# Patient Record
Sex: Male | Born: 1995 | Race: Black or African American | Hispanic: No | Marital: Single | Smoking: Never smoker
Health system: Southern US, Community
[De-identification: ages and names within clinical notes are randomized; demographics above are authoritative.]

---
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
## 2015-09-02 ENCOUNTER — Encounter (HOSPITAL_COMMUNITY): Payer: Self-pay | Admitting: Emergency Medicine

## 2015-09-02 ENCOUNTER — Emergency Department (HOSPITAL_COMMUNITY)
Admission: EM | Admit: 2015-09-02 | Discharge: 2015-09-02 | Disposition: A | Discharge status: Home / Self Care | Payer: BLUE CROSS/BLUE SHIELD | Attending: Emergency Medicine | Admitting: Emergency Medicine

## 2015-09-02 DIAGNOSIS — N342 Other urethritis: Secondary | ICD-10-CM

## 2015-09-02 DIAGNOSIS — R350 Frequency of micturition: Secondary | ICD-10-CM | POA: Diagnosis present

## 2015-09-02 LAB — URINALYSIS, ROUTINE W REFLEX MICROSCOPIC
BILIRUBIN URINE: NEGATIVE
GLUCOSE, UA: NEGATIVE mg/dL
KETONES UR: NEGATIVE mg/dL
Nitrite: NEGATIVE
PH: 7 (ref 5.0–8.0)
Protein, ur: 30 mg/dL — AB
SPECIFIC GRAVITY, URINE: 1.025 (ref 1.005–1.030)

## 2015-09-02 LAB — CBG MONITORING, ED: Glucose-Capillary: 115 mg/dL — ABNORMAL HIGH (ref 65–99)

## 2015-09-02 LAB — URINE MICROSCOPIC-ADD ON: BACTERIA UA: NONE SEEN

## 2015-09-02 MED ORDER — CEFTRIAXONE SODIUM 250 MG IJ SOLR
250.0000 mg | Freq: Once | INTRAMUSCULAR | Status: AC
  Administered 2015-09-02: 250 mg via INTRAMUSCULAR
  Filled 2015-09-02: qty 250

## 2015-09-02 MED ORDER — AZITHROMYCIN 250 MG PO TABS
1000.0000 mg | ORAL_TABLET | Freq: Once | ORAL | Status: AC
  Administered 2015-09-02: 1000 mg via ORAL
  Filled 2015-09-02: qty 4

## 2015-09-02 NOTE — ED Notes (Signed)
Pt in from home with c/o frequent urination since Friday. Reporting some discomfort and hematuria while urinating.

## 2015-09-02 NOTE — Discharge Instructions (Signed)
It was our pleasure to provide your ER care today - we hope that you feel better.  You were treated in the ED today with 2 antibiotics, which should treat your symptoms.  Swabs were sent to the lab, the results of which will be back in 1-2 days.  No sex until after symptoms have completely resolved.   Have any sexual partner(s) checked by their doctor.   Follow up with primary care doctor in 1 week if symptoms fail to improve/resolve.  Return to ER if worse, new symptoms, fevers, abdominal pain, other concern.      Urethritis, Adult Urethritis is an inflammation of the tube through which urine exits your bladder (urethra).  CAUSES Urethritis is often caused by an infection in your urethra. The infection can be viral, like herpes. The infection can also be bacterial, like gonorrhea. RISK FACTORS Risk factors of urethritis include:  Having sex without using a condom.  Having multiple sexual partners.  Having poor hygiene. SIGNS AND SYMPTOMS Symptoms of urethritis are less noticeable in women than in men. These symptoms include:  Burning feeling when you urinate (dysuria).  Discharge from your urethra.  Blood in your urine (hematuria).  Urinating more than usual. DIAGNOSIS  To confirm a diagnosis of urethritis, your health care provider will do the following:  Ask about your sexual history.  Perform a physical exam.  Have you provide a sample of your urine for lab testing.  Use a cotton swab to gently collect a sample from your urethra for lab testing. TREATMENT  It is important to treat urethritis. Depending on the cause, untreated urethritis may lead to serious genital infections and possibly infertility. Urethritis caused by a bacterial infection is treated with antibiotic medicine. All sexual partners must be treated.  HOME CARE INSTRUCTIONS  Do not have sex until the test results are known and treatment is completed, even if your symptoms go away before you  finish treatment.  If you were prescribed an antibiotic, finish it all even if you start to feel better. SEEK MEDICAL CARE IF:   Your symptoms are not improved in 3 days.  Your symptoms are getting worse.  You develop abdominal pain or pelvic pain (in women).  You develop joint pain.  You have a fever. SEEK IMMEDIATE MEDICAL CARE IF:   You have severe pain in the belly, back, or side.  You have repeated vomiting. MAKE SURE YOU:  Understand these instructions.  Will watch your condition.  Will get help right away if you are not doing well or get worse.   This information is not intended to replace advice given to you by your health care provider. Make sure you discuss any questions you have with your health care provider.   Document Released: 09/09/2000 Document Revised: 07/31/2014 Document Reviewed: 11/14/2012 Elsevier Interactive Patient Education 2016 [employer]   Safe Sex Safe sex is about reducing the risk of giving or getting a sexually transmitted disease (STD). STDs are spread through sexual contact involving the genitals, mouth, or rectum. Some STDs can be cured and others cannot. Safe sex can also prevent unintended pregnancies.  WHAT ARE SOME SAFE SEX PRACTICES?  Limit your sexual activity to only one partner who is having sex with only you.  Talk to your partner about his or her past partners, past STDs, and drug use.  Use a condom every time you have sexual intercourse. This includes vaginal, oral, and anal sexual activity. Both females and males should wear condoms  during oral sex. Only use latex or polyurethane condoms and water-based lubricants. Using petroleum-based lubricants or oils to lubricate a condom will weaken the condom and increase the chance that it will break. The condom should be in place from the beginning to the end of sexual activity. Wearing a condom reduces, but does not completely eliminate, your risk of getting or giving an STD. STDs  can be spread by contact with infected body fluids and skin.  Get vaccinated for hepatitis B and HPV.  Avoid alcohol and recreational drugs, which can affect your judgment. You may forget to use a condom or participate in high-risk sex.  For females, avoid douching after sexual intercourse. Douching can spread an infection farther into the reproductive tract.  Check your body for signs of sores, blisters, rashes, or unusual discharge. See your health care provider if you notice any of these signs.  Avoid sexual contact if you have symptoms of an infection or are being treated for an STD. If you or your partner has herpes, avoid sexual contact when blisters are present. Use condoms at all other times.  If you are at risk of being infected with HIV, it is recommended that you take a prescription medicine daily to prevent HIV infection. This is called pre-exposure prophylaxis (PrEP). You are considered at risk if:  You are a man who has sex with other men (MSM).  You are a heterosexual man or woman who is sexually active with more than one partner.  You take drugs by injection.  You are sexually active with a partner who has HIV.  Talk with your health care provider about whether you are at high risk of being infected with HIV. If you choose to begin PrEP, you should first be tested for HIV. You should then be tested every 3 months for as long as you are taking PrEP.  See your health care provider for regular screenings, exams, and tests for other STDs. Before having sex with a new partner, each of you should be screened for STDs and should talk about the results with each other. WHAT ARE THE BENEFITS OF SAFE SEX?   There is less chance of getting or giving an STD.  You can prevent unwanted or unintended pregnancies.  By discussing safe sex concerns with your partner, you may increase feelings of intimacy, comfort, trust, and honesty between the two of you.   This information is not  intended to replace advice given to you by your health care provider. Make sure you discuss any questions you have with your health care provider.   Document Released: 04/23/2004 Document Revised: 04/06/2014 Document Reviewed: 09/07/2011 Elsevier Interactive Patient Education [employer]

## 2015-09-02 NOTE — ED Provider Notes (Signed)
CSN: [id number]     Arrival date & time 09/02/15  0815 History   First MD Initiated Contact with Patient 09/02/15 0825     Chief Complaint  Patient presents with  . Urinary Frequency     (Consider location/radiation/quality/duration/timing/severity/associated sxs/prior Treatment) Patient is a 20 y.o. male presenting with frequency. The history is provided by the patient.  Urinary Frequency Pertinent negatives include no abdominal pain.  Patient c/o urinary frequency, mild dysuria, for the past few days. ?scant penile discharge. Symptoms episodic, persistent. Is sexually active w single partner, no known std exposure.  No hx same symptoms previously. No polydipsia, no hx dm. No fever or chills. Denies back or flank pain.      History reviewed. No pertinent past medical history. History reviewed. No pertinent past surgical history. No family history on file. Social History  Substance Use Topics  . Smoking status: Never Smoker   . Smokeless tobacco: None  . Alcohol Use: No    Review of Systems  Constitutional: Negative for fever and chills.  HENT: Negative for sore throat.   Gastrointestinal: Negative for nausea, vomiting and abdominal pain.  Genitourinary: Positive for dysuria and frequency. Negative for flank pain.  Skin: Negative for rash.      Allergies  Review of patient's allergies indicates no known allergies.  Home Medications   Prior to Admission medications   Medication Sig Start Date End Date Taking? Authorizing Provider  Chlorpheniramine Maleate (ALLERGY RELIEF PO) Take 1 tablet by mouth daily.    Historical Provider, MD   BP 152/101 mmHg  Pulse 97  Temp(Src) 97.8 F (36.6 C) (Oral)  Resp 18  Ht  (1.727 m)  Wt 79.379 kg  BMI 26.61 kg/m2  SpO2 99% Physical Exam  Constitutional: He is oriented to person, place, and time. He appears well-developed and well-nourished. No distress.  Eyes: Conjunctivae are normal. No scleral icterus.  Neck: Neck  supple. No tracheal deviation present.  Cardiovascular: Normal rate.   Pulmonary/Chest: Effort normal. No accessory muscle usage. No respiratory distress.  Abdominal: Soft. He exhibits no distension. There is no tenderness.  Genitourinary:  Scant penile discharge. No abd or cva/flank tenderness.   Musculoskeletal: Normal range of motion.  Neurological: He is alert and oriented to person, place, and time.  Skin: Skin is warm and dry. No rash noted. He is not diaphoretic.  Psychiatric: He has a normal mood and affect.  Nursing note and vitals reviewed.   ED Course  Procedures (including critical care time) Labs Review  Results for orders placed or performed during the hospital encounter of 09/02/15  Urinalysis, Routine w reflex microscopic (not at [facility])  Result Value Ref Range   Color, Urine YELLOW YELLOW   APPearance HAZY (A) CLEAR   Specific Gravity, Urine 1.025 1.005 - 1.030   pH 7.0 5.0 - 8.0   Glucose, UA NEGATIVE NEGATIVE mg/dL   Hgb urine dipstick MODERATE (A) NEGATIVE   Bilirubin Urine NEGATIVE NEGATIVE   Ketones, ur NEGATIVE NEGATIVE mg/dL   Protein, ur 30 (A) NEGATIVE mg/dL   Nitrite NEGATIVE NEGATIVE   Leukocytes, UA MODERATE (A) NEGATIVE  Urine microscopic-add on  Result Value Ref Range   Squamous Epithelial / LPF 0-5 (A) NONE SEEN   WBC, UA TOO NUMEROUS TO COUNT 0 - 5 WBC/hpf   RBC / HPF TOO NUMEROUS TO COUNT 0 - 5 RBC/hpf   Bacteria, UA NONE SEEN NONE SEEN  I have personally reviewed and evaluated these lab results as part of my medical decision-making.    MDM   Labs.  Reviewed nursing notes and prior charts for additional history.   Discussed tx options w pt, pt consents to tx.  Rocephin and zithromax given.   Discussed follow up and return precautions w pt.      Cathren LaineKevin Mylissa Lambe, MD 09/11/15 [phone]

## 2015-09-03 LAB — GC/CHLAMYDIA PROBE AMP (~~LOC~~) NOT AT ARMC
CHLAMYDIA, DNA PROBE: POSITIVE — AB
Neisseria Gonorrhea: NEGATIVE

## 2015-09-04 ENCOUNTER — Telehealth (HOSPITAL_BASED_OUTPATIENT_CLINIC_OR_DEPARTMENT_OTHER): Payer: Self-pay | Admitting: Emergency Medicine

## 2016-07-06 ENCOUNTER — Other Ambulatory Visit: Payer: Self-pay | Admitting: Occupational Medicine

## 2016-07-06 ENCOUNTER — Ambulatory Visit
Admission: RE | Admit: 2016-07-06 | Discharge: 2016-07-06 | Disposition: A | Discharge status: Home / Self Care | Payer: No Typology Code available for payment source | Source: Ambulatory Visit | Attending: Occupational Medicine | Admitting: Occupational Medicine

## 2016-07-06 DIAGNOSIS — Z021 Encounter for pre-employment examination: Secondary | ICD-10-CM

## 2016-07-21 ENCOUNTER — Emergency Department (HOSPITAL_COMMUNITY)
Admission: EM | Admit: 2016-07-21 | Discharge: 2016-07-22 | Disposition: A | Discharge status: Home / Self Care | Payer: Self-pay | Attending: Emergency Medicine | Admitting: Emergency Medicine

## 2016-07-21 ENCOUNTER — Encounter (HOSPITAL_COMMUNITY): Payer: Self-pay | Admitting: Emergency Medicine

## 2016-07-21 DIAGNOSIS — R350 Frequency of micturition: Secondary | ICD-10-CM | POA: Insufficient documentation

## 2016-07-21 DIAGNOSIS — R3129 Other microscopic hematuria: Secondary | ICD-10-CM | POA: Insufficient documentation

## 2016-07-21 LAB — CBC WITH DIFFERENTIAL/PLATELET
Basophils Absolute: 0 10*3/uL (ref 0.0–0.1)
Basophils Relative: 0 %
Eosinophils Absolute: 0.1 10*3/uL (ref 0.0–0.7)
Eosinophils Relative: 1 %
HEMATOCRIT: 40.4 % (ref 39.0–52.0)
Hemoglobin: 13.9 g/dL (ref 13.0–17.0)
LYMPHS ABS: 3.5 10*3/uL (ref 0.7–4.0)
Lymphocytes Relative: 41 %
MCH: 29.3 pg (ref 26.0–34.0)
MCHC: 34.4 g/dL (ref 30.0–36.0)
MCV: 85.2 fL (ref 78.0–100.0)
MONO ABS: 0.8 10*3/uL (ref 0.1–1.0)
Monocytes Relative: 9 %
NEUTROS ABS: 4.1 10*3/uL (ref 1.7–7.7)
Neutrophils Relative %: 49 %
Platelets: 321 10*3/uL (ref 150–400)
RBC: 4.74 MIL/uL (ref 4.22–5.81)
RDW: 12.7 % (ref 11.5–15.5)
WBC: 8.5 10*3/uL (ref 4.0–10.5)

## 2016-07-21 LAB — URINALYSIS, ROUTINE W REFLEX MICROSCOPIC
BILIRUBIN URINE: NEGATIVE
Bacteria, UA: NONE SEEN
GLUCOSE, UA: NEGATIVE mg/dL
Ketones, ur: NEGATIVE mg/dL
NITRITE: NEGATIVE
PH: 6 (ref 5.0–8.0)
Protein, ur: NEGATIVE mg/dL
Specific Gravity, Urine: 1.026 (ref 1.005–1.030)

## 2016-07-21 LAB — BASIC METABOLIC PANEL
ANION GAP: 8 (ref 5–15)
BUN: 14 mg/dL (ref 6–20)
CALCIUM: 9.8 mg/dL (ref 8.9–10.3)
CO2: 26 mmol/L (ref 22–32)
Chloride: 104 mmol/L (ref 101–111)
Creatinine, Ser: 0.82 mg/dL (ref 0.61–1.24)
GFR calc Af Amer: >60 mL/min (ref >60)
GFR calc non Af Amer: >60 mL/min (ref >60)
GLUCOSE: 97 mg/dL (ref 65–99)
Potassium: 4 mmol/L (ref 3.5–5.1)
Sodium: 138 mmol/L (ref 135–145)

## 2016-07-21 MED ORDER — AZITHROMYCIN 250 MG PO TABS
1000.0000 mg | ORAL_TABLET | Freq: Once | ORAL | Status: AC
  Administered 2016-07-22: 1000 mg via ORAL
  Filled 2016-07-21: qty 4

## 2016-07-21 MED ORDER — CEFTRIAXONE SODIUM 250 MG IJ SOLR
250.0000 mg | Freq: Once | INTRAMUSCULAR | Status: AC
  Administered 2016-07-22: 250 mg via INTRAMUSCULAR
  Filled 2016-07-21: qty 250

## 2016-07-21 NOTE — ED Triage Notes (Signed)
Pt. suspects UTI reports urinary frequency , oliguria and bladder pressure onset last week , denies fever or chills .

## 2016-07-22 LAB — GC/CHLAMYDIA PROBE AMP (~~LOC~~) NOT AT ARMC
CHLAMYDIA, DNA PROBE: POSITIVE — AB
Neisseria Gonorrhea: NEGATIVE

## 2016-07-22 NOTE — Medical Student Note (Signed)
[facility]-EMERGENCY DEPT Provider Student Note For educational purposes for Medical, PA and NP students only and not part of the legal medical record.   CSN: [id number] Arrival date & time: 07/21/16  2055     History   Chief Complaint Chief Complaint  Patient presents with  . Urinary Tract Infection    HPI Joshua Horne is a 21 y.o. male. Patient with history of chlamydia reports he was seen at the health department a few days ago for a drug screening and was informed that he had an infection in his urine. He states that since then he has noticed an increased frequency in urination and feels that he isn't emptying his bladder completely due to the small amount of urine voided and feeling of bladder fullness. He denies dysuria, penile discharge, lesions, sores or pain elsewhere. Patient also denies fever, chills, nausea, vomiting, back, flank, and abdominal pain. Patient is sexually active, reporting he has one sexual partner.     Urinary Tract Infection   Associated symptoms include frequency and urgency. Pertinent negatives include no chills, no nausea, no vomiting and no flank pain.    History reviewed. No pertinent past medical history.  There are no active problems to display for this patient.   History reviewed. No pertinent surgical history.     Home Medications    Prior to Admission medications   Not on File    Family History No family history on file.  Social History Social History  Substance Use Topics  . Smoking status: Never Smoker  . Smokeless tobacco: Never Used  . Alcohol use No     Allergies   Patient has no known allergies.   Review of Systems Review of Systems  Constitutional: Negative for chills and fever.  Gastrointestinal: Negative for abdominal pain, nausea and vomiting.  Genitourinary: Positive for frequency and urgency. Negative for discharge, flank pain, genital sores, penile pain and testicular pain.  Musculoskeletal: Negative for  back pain.  All other systems reviewed and are negative.    Physical Exam Updated Vital Signs BP (!) 137/93 (BP Location: Left Arm)   Pulse 78   Temp 98.4 F (36.9 C) (Oral)   Resp 16   Ht  (1.727 m)   Wt 95.3 kg   SpO2 98%   BMI 31.93 kg/m   Physical Exam  Constitutional: He is oriented to person, place, and time. He appears well-developed and well-nourished.  Neck: Normal range of motion.  Cardiovascular: Normal rate and regular rhythm.   Pulmonary/Chest: Effort normal. No respiratory distress.  Abdominal: Soft. He exhibits no distension. There is no tenderness.  Genitourinary: Testes normal and penis normal. Right testis shows no swelling and no tenderness. Left testis shows no swelling and no tenderness. No penile tenderness. No discharge found.  Musculoskeletal: Normal range of motion.  Neurological: He is alert and oriented to person, place, and time.  Skin: Skin is warm and dry. No rash noted.  Psychiatric: He has a normal mood and affect. His behavior is normal.  Vitals reviewed.    ED Treatments / Results  Labs (all labs ordered are listed, but only abnormal results are displayed) Labs Reviewed  URINALYSIS, ROUTINE W REFLEX MICROSCOPIC - Abnormal; Notable for the following:       Result Value   Hgb urine dipstick SMALL (*)    Leukocytes, UA TRACE (*)    Squamous Epithelial / LPF 0-5 (*)    All other components within normal limits  CBC WITH  DIFFERENTIAL/PLATELET  BASIC METABOLIC PANEL  GC/CHLAMYDIA PROBE AMP ([place]) NOT AT [facility]    EKG  EKG Interpretation None       Radiology No results found.  Procedures Procedures (including critical care time)  Medications Ordered in ED Medications  azithromycin (ZITHROMAX) tablet 1,000 mg (not administered)  cefTRIAXone (ROCEPHIN) injection 250 mg (not administered)     Initial Impression / Assessment and Plan / ED Course  I have reviewed the triage vital signs and the nursing  notes.  Pertinent labs & imaging results that were available during my care of the patient were reviewed by me and considered in my medical decision making (see chart for details).   Discussed UA results and treatment options with the patient and the patient consents to treatment. Rocephin and zithromax given.    Patient advised to have a medical follow up to ensure the infection has cleared. Return precautions also discussed with patient.        Final Clinical Impressions(s) / ED Diagnoses   Final diagnoses:  None    New Prescriptions New Prescriptions   No medications on file

## 2016-07-22 NOTE — ED Provider Notes (Signed)
[facility] DEPT Provider Note   CSN: [id number] Arrival date & time: 07/21/16  2055     History   Chief Complaint Chief Complaint  Patient presents with  . Urinary Tract Infection    HPI Joshua Horne is a 21 y.o. male.  Patient with history of Chlamydia presents with several days of increased urinary frequency and hematuria. Patient was told that he had blood in his urine during recent drug testing. No fevers, back pain, vomiting. Symptoms are similar previous to previous ED visit where he tested positive for Chlamydia. Patient states he has 1 sexual partner. No other medical complaints. No treatments prior. The onset of this condition was acute. The course is constant. Aggravating factors: none. Alleviating factors: none.        History reviewed. No pertinent past medical history.  There are no active problems to display for this patient.   History reviewed. No pertinent surgical history.     Home Medications    Prior to Admission medications   Not on File    Family History No family history on file.  Social History Social History  Substance Use Topics  . Smoking status: Never Smoker  . Smokeless tobacco: Never Used  . Alcohol use No     Allergies   Patient has no known allergies.   Review of Systems Review of Systems  Constitutional: Negative for fever.  HENT: Negative for sore throat.   Eyes: Negative for discharge.  Gastrointestinal: Negative for rectal pain.  Genitourinary: Positive for frequency. Negative for discharge, dysuria, genital sores, hematuria (no visible), penile pain and testicular pain.  Musculoskeletal: Negative for arthralgias.  Skin: Negative for rash.  Hematological: Negative for adenopathy.     Physical Exam Updated Vital Signs BP (!) 137/93 (BP Location: Left Arm)   Pulse 78   Temp 98.4 F (36.9 C) (Oral)   Resp 16   Ht  (1.727 m)   Wt 95.3 kg   SpO2 98%   BMI 31.93 kg/m   Physical Exam    Constitutional: He appears well-developed and well-nourished.  HENT:  Head: Normocephalic and atraumatic.  Mouth/Throat: Oropharynx is clear and moist.  Eyes: Conjunctivae are normal. Right eye exhibits no discharge. Left eye exhibits no discharge.  Neck: Normal range of motion. Neck supple.  Cardiovascular: Normal rate, regular rhythm and normal heart sounds.   Pulmonary/Chest: Effort normal and breath sounds normal.  Abdominal: Soft. There is no tenderness.  Genitourinary: Testes normal. Circumcised. No discharge found.  Neurological: He is alert.  Skin: Skin is warm and dry.  Psychiatric: He has a normal mood and affect.  Nursing note and vitals reviewed.    ED Treatments / Results  Labs (all labs ordered are listed, but only abnormal results are displayed) Labs Reviewed  URINALYSIS, ROUTINE W REFLEX MICROSCOPIC - Abnormal; Notable for the following:       Result Value   Hgb urine dipstick SMALL (*)    Leukocytes, UA TRACE (*)    Squamous Epithelial / LPF 0-5 (*)    All other components within normal limits  CBC WITH DIFFERENTIAL/PLATELET  BASIC METABOLIC PANEL  GC/CHLAMYDIA PROBE AMP ([place]) NOT AT [facility]    EKG  EKG Interpretation None       Radiology No results found.  Procedures Procedures (including critical care time)  Medications Ordered in ED Medications  azithromycin (ZITHROMAX) tablet 1,000 mg (not administered)  cefTRIAXone (ROCEPHIN) injection 250 mg (not administered)     Initial Impression /  Assessment and Plan / ED Course  I have reviewed the triage vital signs and the nursing notes.  Pertinent labs & imaging results that were available during my care of the patient were reviewed by me and considered in my medical decision making (see chart for details).     Patient seen and examined. Work-up reviewed. Medications ordered.   Vital signs reviewed and are as follows: BP (!) 137/93 (BP Location: Left Arm)   Pulse 78   Temp 98.4 F  (36.9 C) (Oral)   Resp 16   Ht  (1.727 m)   Wt 95.3 kg   SpO2 98%   BMI 31.93 kg/m   Will test and treat for STD exposure. Encouraged patient to follow-up in 1-2 weeks for recheck of UA Patient verbalizes understanding and agrees with plan.      Final Clinical Impressions(s) / ED Diagnoses   Final diagnoses:  Urinary frequency  Microscopic hematuria   Patient with irritative urinary tract infection symptoms and microscopic hematuria. Will treat for STI. Patient to follow-up, referral given, for recheck of UA.  New Prescriptions New Prescriptions   No medications on file     Renne Crigler, PA-C 07/22/16 0033    Geoffery Lyons, MD 07/22/16 [phone]

## 2016-07-22 NOTE — Discharge Instructions (Signed)
Please read and follow all provided instructions.  Your diagnoses today include:  1. Urinary frequency   2. Microscopic hematuria    Tests performed today include:  Urine test - shows small amount of blood and infection fighting cells  Vital signs. See below for your results today.   Medications prescribed:   None  Home care instructions:  Follow any educational materials contained in this packet.  Follow-up instructions: Please follow-up with your primary care provider in 1-2 weeks for recheck of the urine.  Return instructions:   Please return to the Emergency Department if you experience worsening symptoms.   Return with fever, worsening pain, persistent vomiting, worsening pain in your back.   Please return if you have any other emergent concerns.  Additional Information:  Your vital signs today were: BP (!) 137/93 (BP Location: Left Arm)    Pulse 78    Temp 98.4 F (36.9 C) (Oral)    Resp 16    Ht  (1.727 m)    Wt 95.3 kg    SpO2 98%    BMI 31.93 kg/m  If your blood pressure (BP) was elevated above 135/85 this visit, please have this repeated by your doctor within one month. --------------

## 2019-01-22 IMAGING — CR DG CHEST 1V
1 series · 1 of 1 positions shown · non-contrast
Comparison: None.

CLINICAL DATA: Pre-employment examination

EXAM:
CHEST 1 VIEW

[w chest pa]
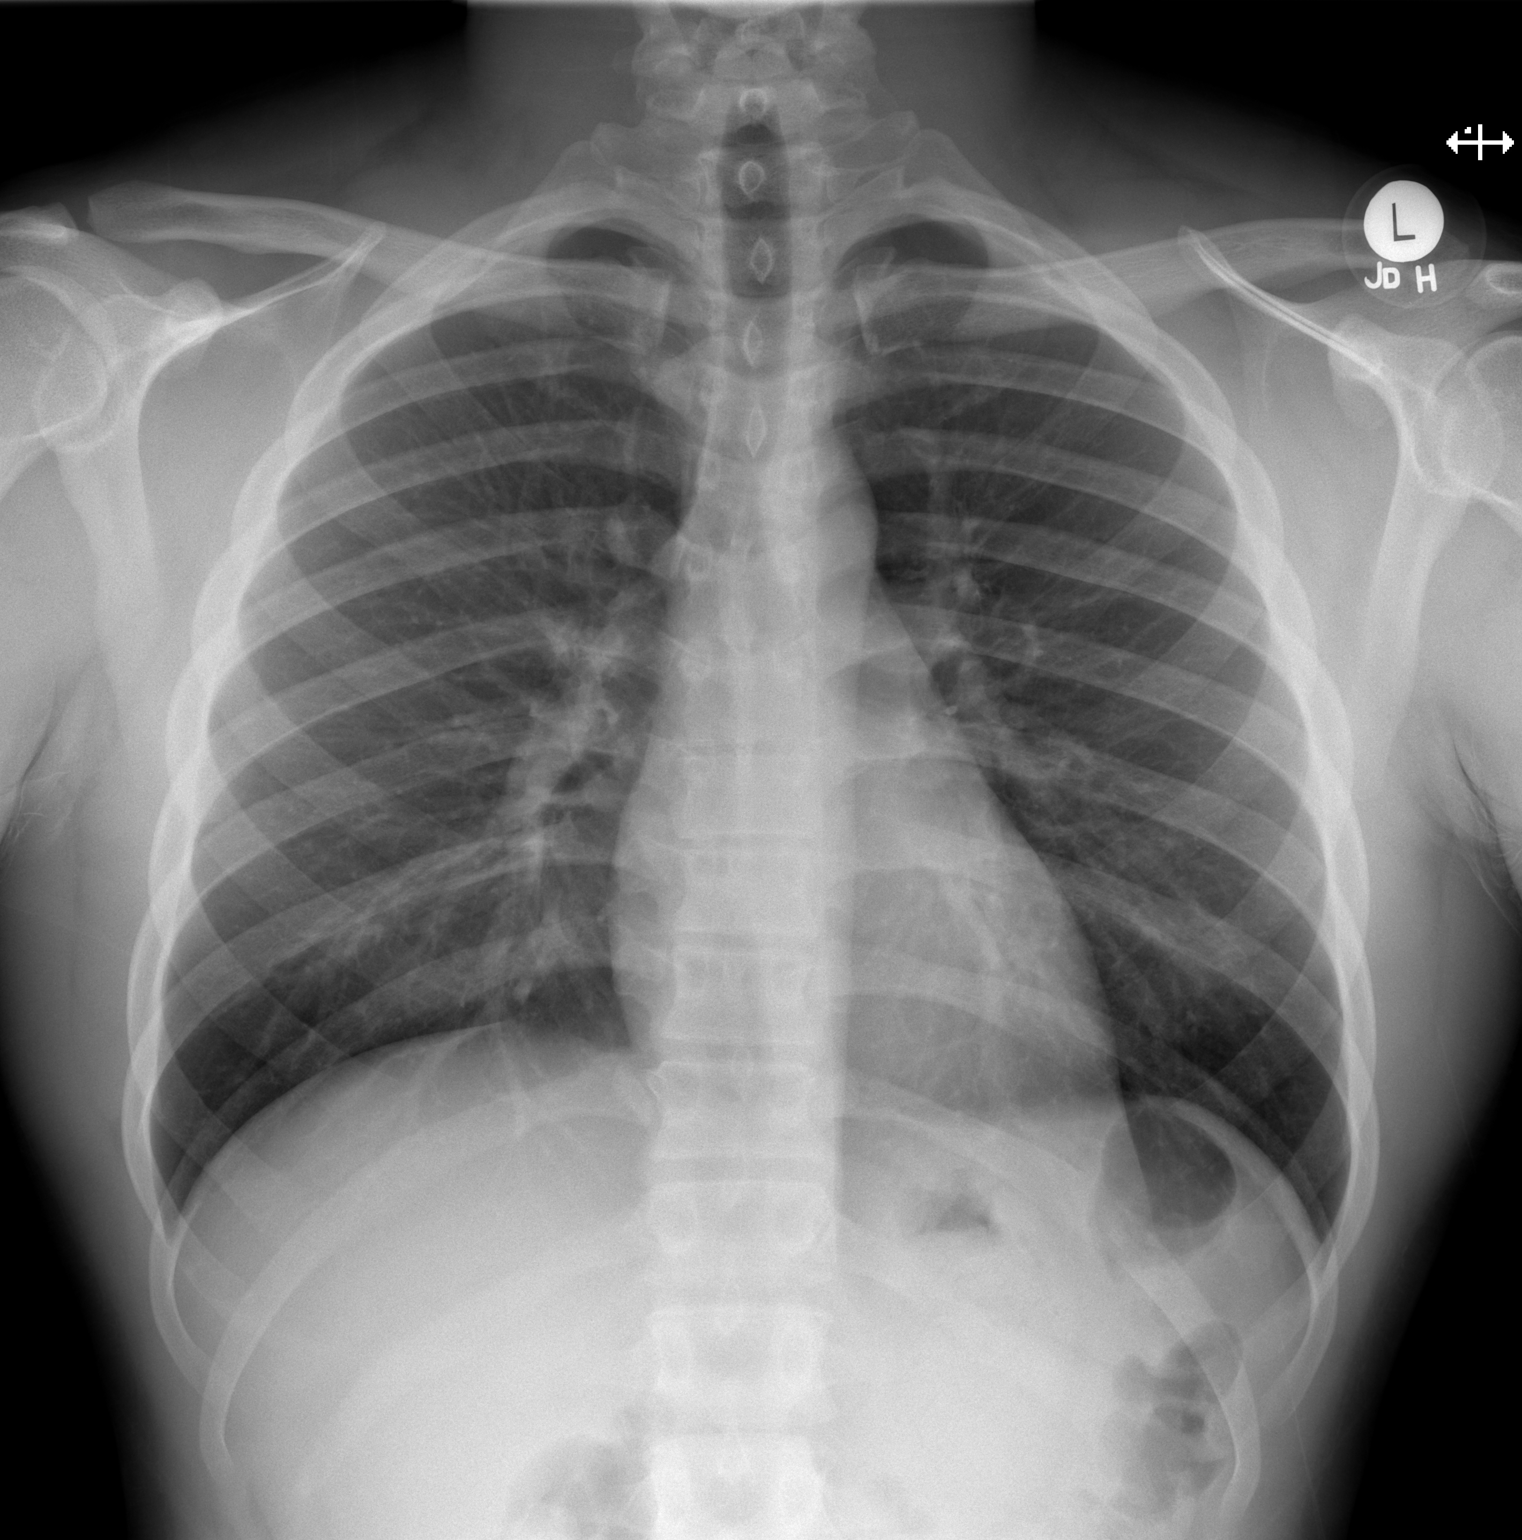

[1 of 1 positions shown; findings below may reference images not displayed]

FINDINGS: Cardiomediastinal silhouette is unremarkable. No infiltrate or
pleural effusion. No pulmonary edema. Bony thorax is unremarkable.
IMPRESSION: No active disease.
